# Patient Record
Sex: Female | Born: 1980
Health system: Southern US, Academic
[De-identification: ages and names within clinical notes are randomized; demographics above are authoritative.]

## PROBLEM LIST (undated history)

## (undated) ENCOUNTER — Encounter

## (undated) HISTORY — PX: HERNIA REPAIR: SHX51

## (undated) HISTORY — PX: RHINOPLASTY: SHX2354

## (undated) HISTORY — PX: TUBAL LIGATION: SHX77

## (undated) HISTORY — PX: BREAST ENHANCEMENT SURGERY: SHX7

## (undated) HISTORY — PX: TONSILLECTOMY: SUR1361

---

## 2016-08-12 ENCOUNTER — Ambulatory Visit: Attending: Internal Medicine | Primary: Family Medicine

## 2016-08-12 DIAGNOSIS — A044 Other intestinal Escherichia coli infections: Secondary | ICD-10-CM

## 2016-08-12 DIAGNOSIS — A048 Other specified bacterial intestinal infections: Principal | ICD-10-CM

## 2016-08-12 DIAGNOSIS — R110 Nausea: Secondary | ICD-10-CM

## 2016-08-12 MED ORDER — BUPROPION HCL ER (XL) 150 MG PO TB24
1 refills
Start: 2016-08-12 — End: ?

## 2016-08-12 MED ORDER — ONDANSETRON HCL 8 MG PO TABS: Start: 2016-08-12 — End: 2016-08-13

## 2016-08-12 MED ORDER — ARMODAFINIL 250 MG PO TABS
ORAL_TABLET
Start: 2016-08-12 — End: ?

## 2016-08-12 MED ORDER — PROMETHAZINE HCL 12.5 MG PO TABS
0 refills
Start: 2016-08-12 — End: ?

## 2016-08-12 MED ORDER — ONDANSETRON HCL 8 MG PO TABS
8 mg | Freq: Three times a day (TID) | ORAL | 3 refills | Status: CP | PRN
Start: 2016-08-12 — End: ?

## 2016-08-12 MED ORDER — VIIBRYD 40 MG PO TABS
ORAL_TABLET | 1 refills
Start: 2016-08-12 — End: ?

## 2016-08-15 NOTE — Progress Notes
2. Symptomatic support  3. Consider cholelithiasis    Above plan explained in detail to the patient who understands and agrees. All questions answered.    Esophagogastroduodenoscopy:  The risks, benefits and alternatives were explained in detail to the patient. Risks including but not limited to bleeding, perforation, adverse reaction to medications, cardiopulmonary compromise and their attendant sequelae explained. Sequelae including but not limited to need for surgery, prolonged hospital stay, placement of drainage tubes, blood transfusions, disability, deformity, morbidity, and mortality was explained. The patient indicates understanding and wishes to proceed.

## 2016-08-15 NOTE — Progress Notes
GI: No vomiting, change in bowel habits, blood in stool, dysphagia, heartburn   GU: No dysuria, urgency or incontinence  NEURO: No dizziness, numbness, MS changes, motor weakness, or syncope      Medications:    Objective:   Reviewed and updated in the patient's EMR.  Current Outpatient Prescriptions   Medication Sig Dispense Refill   ? Armodafinil 250 MG PO Tablet      ? buPROPion HCl (WELLBUTRIN XL) 150 MG PO Tablet Extended Release 24 Hour TAKE 3 TABLETS BY MOUTH EVERY DAY.  1   ? ondansetron (ZOFRAN) 8 MG PO Tablet Take 1 tablet by mouth every 8 hours as needed. 60 tablet 3   ? [DISCONTINUED] ondansetron (ZOFRAN) 8 MG PO Tablet      ? promethazine (PHENERGAN) 12.5 MG PO Tablet TAKE 1 TABLET EVERY 4 HOURS FOR 14 DAYS AS NEEDED FOR NAUSEA  0   ? VIIBRYD 40 MG PO Tablet TAKE 1 TABLET EVERY DAY WITH FOOD  1     No current facility-administered medications for this visit.        Objective:     PHYSICAL EXAM:  BP 107/68 - Pulse 83 - Temp 37.2 ?C (98.9 ?F) - Ht 1.6 m (5\' 3" ) - Wt 54.4 kg (120 lb) - BMI 21.26 kg/m2  Body mass index is 21.26 kg/(m^2).    CONSTITUTION(General Appearance) - Healthy appearing, looks stated age.  HEENT -  Normocephalic, atraumatic. No scleral icterus.   RESPIRATORY - Lungs clear to auscultation, normal respiratory effort.  CARDIOVASCULAR -  Normal heart sounds, normal abdominal aorta. Peripheral pulses present.   ABDOMEN -  Soft, nontender.  No organomegaly.  Normal bowel sounds. Non-distended.  EXTREMITIES -   No edema. Normal color.  MUSCULOSKELETAL - Normal gait and proximal muscle strength in all four extremities.  NEUROLOGICAL - No obvious deficits. Normal gait  PSYCHIATRIC - Normal mood and oriented to person place and time, normal thought pattern, no flight of ideas or pressured speech.  RECTAL: Deferred      Assessment & Plan:       ICD-10-CM ICD-9-CM   1. Nausea without vomiting R11.0 787.02       Plan:  1. EGD - H pylori? Last scope with ulcer while on PPI, no Hp.

## 2016-08-15 NOTE — Progress Notes
[facility]    Patient: Sara Walters  MRN: [id number]  DOB: 01/16/1981  PCP: Patient Care Team:  Derinda SisMendez, Michelle Renae, MD as PCP - General (Family Medicine)  Derinda SisMendez, Michelle Renae,*    Chief Complaint   Patient presents with   ? New Patient     last visit 10/24/12         Lab or Diagnostic Testing:   OSH records reviewed in MEDIA section in EMR and/or patient's old records.    Brief review:  Endoscopy:  2014-12 EGD - epigastric pain, N/V - nl eso, few non-bleeding gastric ulcers (reactive gastropahty, no H pylori), no HH, nl duo    Labs:    Imaging:      Subjective:       HPI:  Sara Walters is a 35 y.o. female who is here for follow up. Last visit was 2014 for EGD.    Apparently has had persistent nausea since then. Never had complete sx resolution. Comes/goes. Nausea has worsened recently. No melena. No particular pattern.    The patient's medical records and laboratory data was reviewed.    Allergies:    She has No Known Allergies.    Active Problem List:  Patient Active Problem List   Diagnosis   ? Nausea with vomiting   ? Abdominal pain       Past Medical History:   Reviewed and updated in the patient's EMR  Past Medical History:   Diagnosis Date   ? E coli enteritis 1998   ? Helicobacter pylori (H. pylori) 2010       Surgical History:   Past Surgical History:   Procedure Laterality Date   ? RHINOPLASTY     ? TONSILLECTOMY     ? UPPER GASTROINTESTINAL ENDOSCOPY          Social History:   She  reports that she has never smoked. She does not have any smokeless tobacco history on file. She reports that she does not drink alcohol or use illicit drugs.     Family History:   Her family history includes No Known Problems in her father and mother.       REVIEW OF SYSTEMS:  CONSTITUTIONAL: Appetite good, no fevers, fatigue or weight loss, no headache   CV: No chest pain, palpitations, PND or peripheral edema   RESPIRATORY: No cough, shortness of breath, wheezing or dyspnea

## 2016-08-24 ENCOUNTER — Encounter: Attending: Internal Medicine | Primary: Family Medicine

## 2019-05-17 ENCOUNTER — Other Ambulatory Visit: Payer: Self-pay

## 2019-05-17 ENCOUNTER — Emergency Department (HOSPITAL_BASED_OUTPATIENT_CLINIC_OR_DEPARTMENT_OTHER)
Admission: EM | Admit: 2019-05-17 | Discharge: 2019-05-17 | Disposition: A | Discharge status: Home / Self Care | Payer: Self-pay | Attending: Emergency Medicine | Admitting: Emergency Medicine

## 2019-05-17 ENCOUNTER — Encounter (HOSPITAL_BASED_OUTPATIENT_CLINIC_OR_DEPARTMENT_OTHER): Payer: Self-pay | Admitting: *Deleted

## 2019-05-17 ENCOUNTER — Emergency Department (HOSPITAL_BASED_OUTPATIENT_CLINIC_OR_DEPARTMENT_OTHER): Payer: Self-pay

## 2019-05-17 DIAGNOSIS — W51XXXA Accidental striking against or bumped into by another person, initial encounter: Secondary | ICD-10-CM | POA: Insufficient documentation

## 2019-05-17 DIAGNOSIS — Y999 Unspecified external cause status: Secondary | ICD-10-CM | POA: Insufficient documentation

## 2019-05-17 DIAGNOSIS — Y92009 Unspecified place in unspecified non-institutional (private) residence as the place of occurrence of the external cause: Secondary | ICD-10-CM | POA: Insufficient documentation

## 2019-05-17 DIAGNOSIS — M549 Dorsalgia, unspecified: Secondary | ICD-10-CM | POA: Insufficient documentation

## 2019-05-17 DIAGNOSIS — Y939 Activity, unspecified: Secondary | ICD-10-CM | POA: Insufficient documentation

## 2019-05-17 DIAGNOSIS — S9031XA Contusion of right foot, initial encounter: Secondary | ICD-10-CM | POA: Insufficient documentation

## 2019-05-17 DIAGNOSIS — G8929 Other chronic pain: Secondary | ICD-10-CM | POA: Insufficient documentation

## 2019-05-17 MED ORDER — METHYLPREDNISOLONE 4 MG PO TBPK: ORAL_TABLET | ORAL | 0 refills | Status: AC

## 2019-05-17 NOTE — ED Provider Notes (Signed)
[facility] Provider Note   CSN: [id number] Arrival date & time: 05/17/19  1826     History   Chief Complaint Chief Complaint  Patient presents with  . Back Pain  . Foot Pain    HPI Cynthia Mcknight is a 38 y.o. female.     The history is provided by the patient.  Back Pain Location:  Thoracic spine Quality:  Aching Radiates to:  Does not radiate Pain severity:  Mild Pain is:  Unable to specify Onset quality:  Gradual Timing:  Intermittent Progression:  Waxing and waning Chronicity:  Chronic Context: physical stress   Context comment:  Also, states recently broke right foot and daughter stepped on foot recently and concerned for new injury.  Relieved by:  NSAIDs Worsened by:  Movement Associated symptoms: no abdominal pain, no abdominal swelling, no bladder incontinence, no bowel incontinence, no chest pain, no dysuria, no fever, no headaches, no leg pain, no numbness, no paresthesias, no pelvic pain, no perianal numbness, no tingling, no weakness and no weight loss     History reviewed. No pertinent past medical history.  There are no active problems to display for this patient.   Past Surgical History:  Procedure Laterality Date  . BREAST ENHANCEMENT SURGERY    . HERNIA REPAIR    . RHINOPLASTY    . TONSILLECTOMY    . TUBAL LIGATION       OB History   No obstetric history on file.      Home Medications    Prior to Admission medications   Not on File    Family History History reviewed. No pertinent family history.  Social History Social History   Tobacco Use  . Smoking status: Never Smoker  . Smokeless tobacco: Never Used  Substance Use Topics  . Alcohol use: Not Currently  . Drug use: Not Currently     Allergies   Patient has no known allergies.   Review of Systems Review of Systems  Constitutional: Negative for chills, fever and weight loss.  HENT: Negative for ear pain and sore throat.   Eyes:  Negative for pain and visual disturbance.  Respiratory: Negative for cough and shortness of breath.   Cardiovascular: Negative for chest pain and palpitations.  Gastrointestinal: Negative for abdominal pain, bowel incontinence and vomiting.  Genitourinary: Negative for bladder incontinence, dysuria, hematuria and pelvic pain.  Musculoskeletal: Positive for arthralgias and back pain. Negative for gait problem, joint swelling, myalgias, neck pain and neck stiffness.  Skin: Negative for color change and rash.  Neurological: Negative for tingling, seizures, syncope, weakness, numbness, headaches and paresthesias.  All other systems reviewed and are negative.    Physical Exam Updated Vital Signs  ED Triage Vitals  Enc Vitals Group     BP 05/17/19 1839 (!) 147/85     Pulse Rate 05/17/19 1839 87     Resp 05/17/19 1839 18     Temp 05/17/19 1839 97.9 F (36.6 C)     Temp src --      SpO2 05/17/19 1839 100 %     Weight 05/17/19 1836 126 lb (57.2 kg)     Height 05/17/19 1836 5\' 3"  (1.6 m)     Head Circumference --      Peak Flow --      Pain Score 05/17/19 1836 6     Pain Loc --      Pain Edu? --      Excl. in [place]? --  Physical Exam Vitals signs and nursing note reviewed.  Constitutional:      General: She is not in acute distress.    Appearance: She is well-developed.  HENT:     Head: Normocephalic and atraumatic.     Mouth/Throat:     Mouth: Mucous membranes are moist.  Eyes:     Extraocular Movements: Extraocular movements intact.     Conjunctiva/sclera: Conjunctivae normal.     Pupils: Pupils are equal, round, and reactive to light.  Neck:     Musculoskeletal: Normal range of motion and neck supple. No muscular tenderness.  Cardiovascular:     Rate and Rhythm: Normal rate and regular rhythm.     Pulses: Normal pulses.     Heart sounds: Normal heart sounds. No murmur.  Pulmonary:     Effort: Pulmonary effort is normal. No respiratory distress.     Breath sounds:  Normal breath sounds.  Abdominal:     Palpations: Abdomen is soft.     Tenderness: There is no abdominal tenderness.  Musculoskeletal: Normal range of motion.        General: Tenderness (TTP to right foot and left paraspinal thoracic muscles) present.     Comments: No midline spinal tenderness   Skin:    General: Skin is warm and dry.  Neurological:     General: No focal deficit present.     Mental Status: She is alert and oriented to person, place, and time.     Cranial Nerves: No cranial nerve deficit.     Sensory: No sensory deficit.     Motor: No weakness.     Coordination: Coordination normal.     Gait: Gait normal.     Comments: 5+ out of 5 strength throughout, normal sensation, no drift, normal finger-to-nose finger  Psychiatric:        Mood and Affect: Mood normal.      ED Treatments / Results  Labs (all labs ordered are listed, but only abnormal results are displayed) Labs Reviewed - No data to display  EKG None  Radiology Dg Thoracic Spine 2 View  Result Date: 05/17/2019 CLINICAL DATA:  38 year old female with back pain. EXAM: THORACIC SPINE 2 VIEWS COMPARISON:  None. FINDINGS: There is no evidence of thoracic spine fracture. Alignment is normal. No other significant bone abnormalities are identified. IMPRESSION: Negative. Electronically Signed   By: Elgie Collard M.D.   On: 05/17/2019 19:21   Dg Foot Complete Right  Result Date: 05/17/2019 CLINICAL DATA:  Right foot pain, swelling EXAM: RIGHT FOOT COMPLETE - 3+ VIEW COMPARISON:  None. FINDINGS: There is no evidence of fracture or dislocation. There is no evidence of arthropathy or other focal bone abnormality. Soft tissues are unremarkable. IMPRESSION: Negative. Electronically Signed   By: Charlett Nose M.D.   On: 05/17/2019 19:20    Procedures Procedures (including critical care time)  Medications Ordered in ED Medications - No data to display   Initial Impression / Assessment and Plan / ED Course  I have  reviewed the triage vital signs and the nursing notes.  Pertinent labs & imaging results that were available during my care of the patient were reviewed by me and considered in my medical decision making (see chart for details).     Cynthia Mcknight is a 38 year old female with no significant medical history presents to the ED with back pain, right foot pain.  Patient recently came out of a walking boot for right foot fracture.  States that her daughter stepped  on her foot recently and she is been having pain.  Has some tenderness to the right foot.  No obvious deformity.  We will get an x-ray.  She has had upper back pain for the last several months on and off.  No trauma.  Has improved with conservative treatment with NSAIDs, lidocaine patches.  Has no real focal tenderness on exam.  She states that is mostly upper thoracic in nature.  Possibly some tenderness in the left paraspinal area around the scapula on the left.  Will get an x-ray as she has not had any images there.  She has no fever.  No neurological symptoms.  Doubt any type of cord compression or cord infection.  Does not use IV drugs.  X-rays of the right foot and back are unremarkable.  Likely musculoskeletal in nature.  Will prescribe steroid Dosepak.  Given information to follow-up with primary care doctor.  This chart was dictated using voice recognition software.  Despite best efforts to proofread,  errors can occur which can change the documentation meaning.    Final Clinical Impressions(s) / ED Diagnoses   Final diagnoses:  Contusion of right foot, initial encounter  Chronic back pain, unspecified back location, unspecified back pain laterality    ED Discharge Orders    None       Virgina NorfolkCuratolo, Ramiyah Mcclenahan, DO 05/17/19 1928

## 2019-05-17 NOTE — ED Notes (Signed)
Pt states upper central back causes her pain when lying, continues to take tylenol/ibuprofen with no relief.

## 2019-05-17 NOTE — ED Triage Notes (Signed)
Pt c/o upper left back pain x 4 days

## 2020-12-16 IMAGING — DX DG FOOT COMPLETE 3+V*R*
3 series · 3 of 3 positions shown · non-contrast
Comparison: None.

CLINICAL DATA: Right foot pain, swelling

EXAM:
RIGHT FOOT COMPLETE - 3+ VIEW

[foot ap]
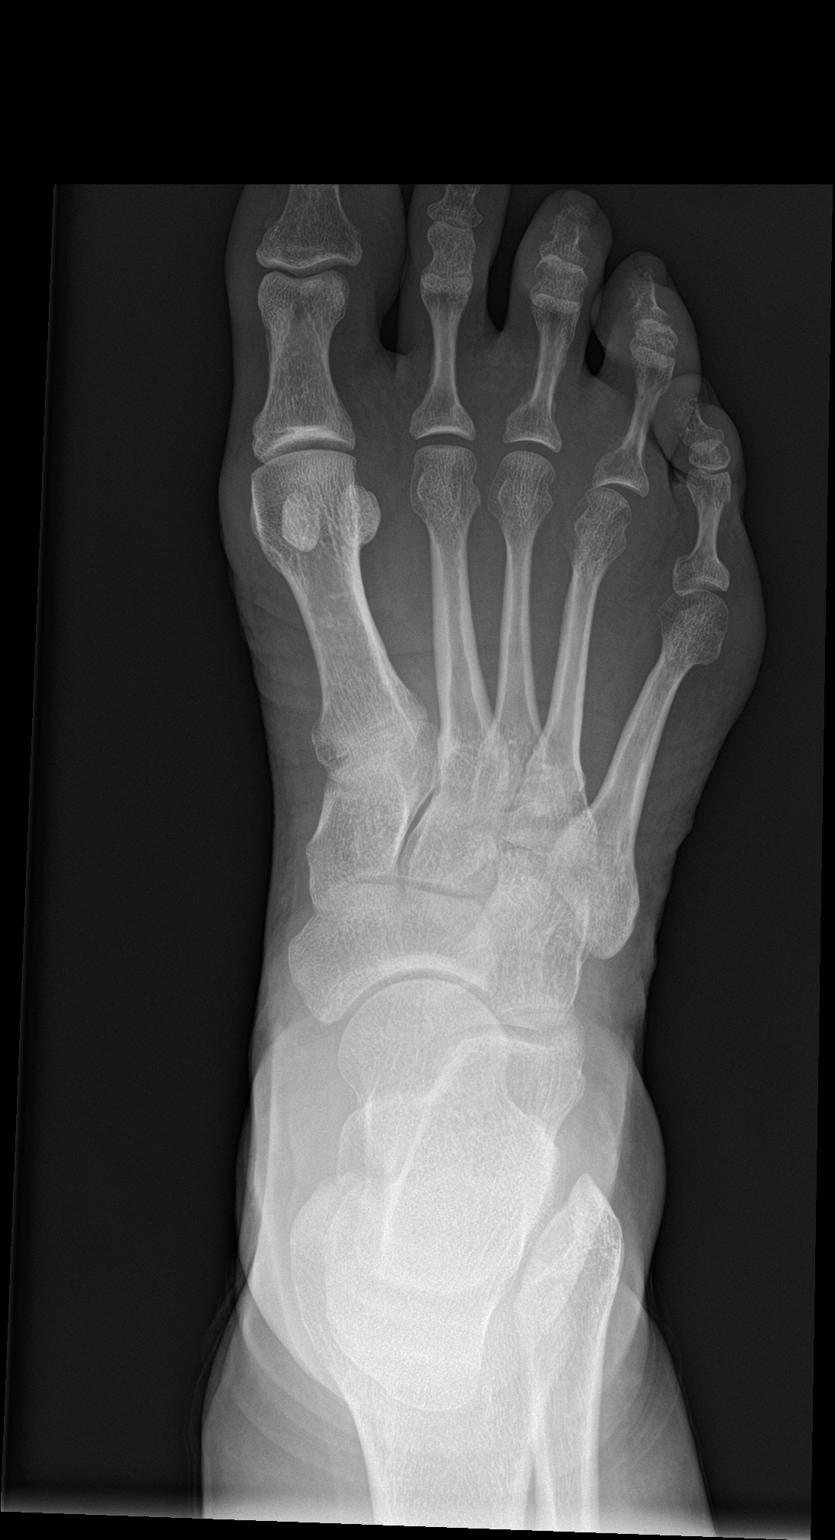

[foot obl]
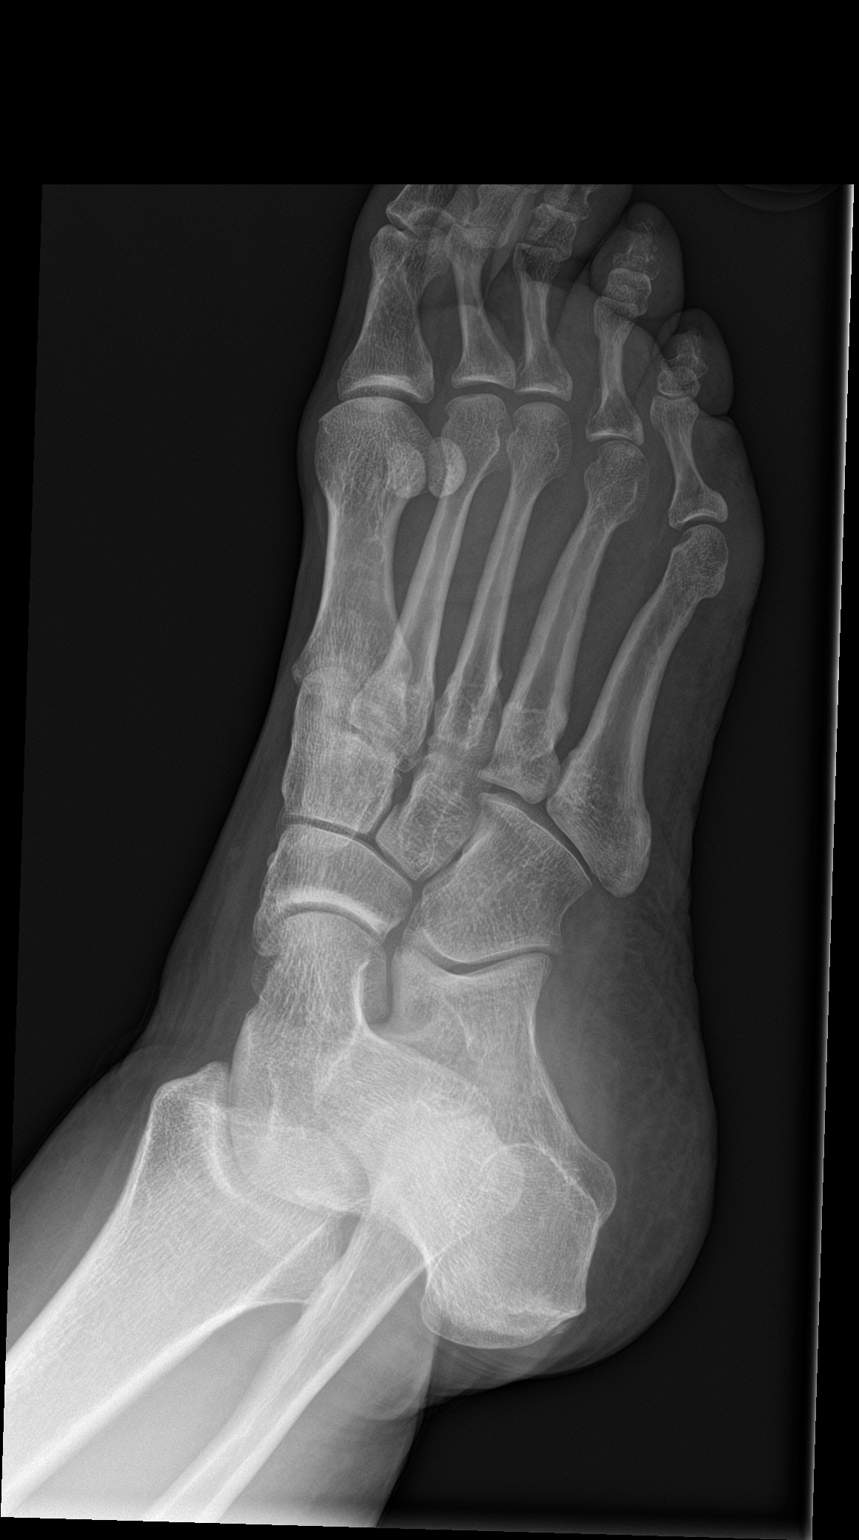

[foot lat]
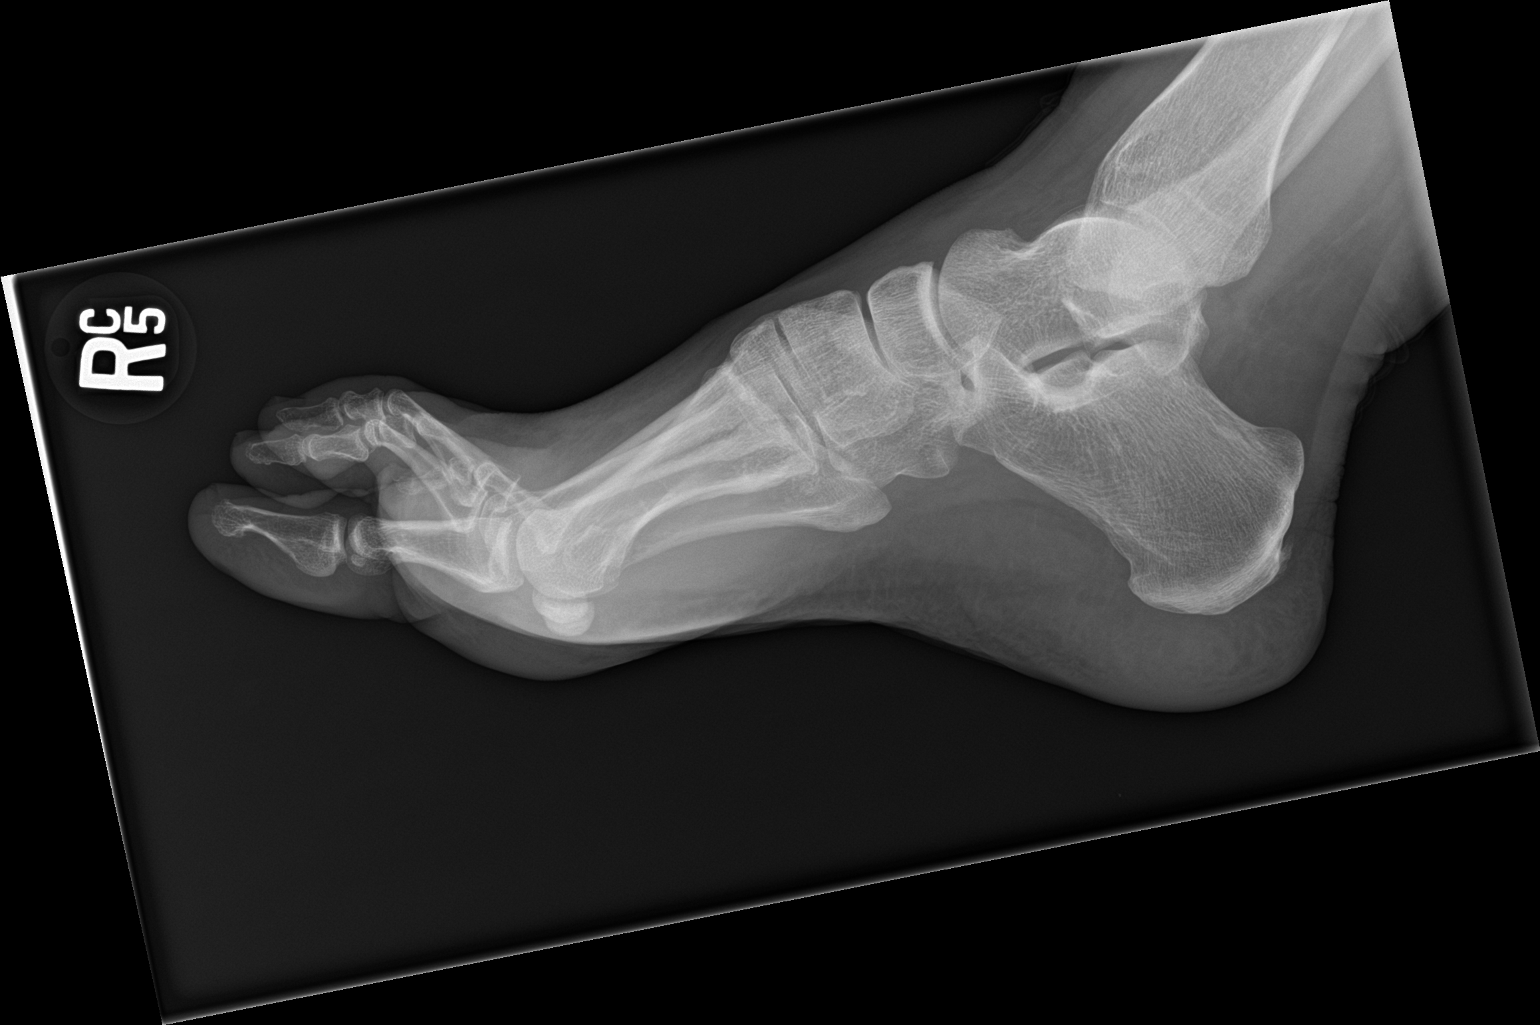

[3 of 3 positions shown; findings below may reference images not displayed]

FINDINGS: There is no evidence of fracture or dislocation. There is no
evidence of arthropathy or other focal bone abnormality. Soft
tissues are unremarkable.
IMPRESSION: Negative.
